# Patient Record
Sex: Female | Born: 1960 | Race: Black or African American | Hispanic: No | Marital: Married | Smoking: Never smoker
Health system: Southern US, Community
[De-identification: ages and names within clinical notes are randomized; demographics above are authoritative.]

## PROBLEM LIST (undated history)

## (undated) DIAGNOSIS — I10 Essential (primary) hypertension: Secondary | ICD-10-CM

## (undated) DIAGNOSIS — M109 Gout, unspecified: Secondary | ICD-10-CM

## (undated) HISTORY — DX: Essential (primary) hypertension: I10

## (undated) HISTORY — DX: Gout, unspecified: M10.9

## (undated) HISTORY — PX: BREAST REDUCTION SURGERY: SHX8

---

## 2004-12-26 ENCOUNTER — Emergency Department (HOSPITAL_COMMUNITY): Admission: EM | Admit: 2004-12-26 | Discharge: 2004-12-26 | Payer: Self-pay | Admitting: Family Medicine

## 2006-06-04 ENCOUNTER — Encounter: Admission: RE | Admit: 2006-06-04 | Discharge: 2006-06-04 | Payer: Self-pay | Admitting: Family Medicine

## 2009-01-28 ENCOUNTER — Encounter: Admission: RE | Admit: 2009-01-28 | Discharge: 2009-01-28 | Payer: Self-pay | Admitting: Internal Medicine

## 2010-01-24 ENCOUNTER — Ambulatory Visit: Payer: Self-pay | Admitting: Family Medicine

## 2010-02-08 ENCOUNTER — Ambulatory Visit: Payer: Self-pay | Admitting: Family Medicine

## 2010-06-27 ENCOUNTER — Ambulatory Visit: Payer: Self-pay | Admitting: Family Medicine

## 2010-12-10 ENCOUNTER — Encounter: Payer: Self-pay | Admitting: Internal Medicine

## 2011-05-04 ENCOUNTER — Other Ambulatory Visit (HOSPITAL_COMMUNITY)
Admission: RE | Admit: 2011-05-04 | Discharge: 2011-05-04 | Disposition: A | Discharge status: Home / Self Care | Payer: BC Managed Care – PPO | Source: Ambulatory Visit | Attending: Family Medicine | Admitting: Family Medicine

## 2011-05-04 ENCOUNTER — Other Ambulatory Visit: Payer: Self-pay | Admitting: Family Medicine

## 2011-05-04 DIAGNOSIS — Z1159 Encounter for screening for other viral diseases: Secondary | ICD-10-CM | POA: Insufficient documentation

## 2011-05-04 DIAGNOSIS — Z124 Encounter for screening for malignant neoplasm of cervix: Secondary | ICD-10-CM | POA: Insufficient documentation

## 2011-07-16 ENCOUNTER — Encounter: Payer: Self-pay | Admitting: Family Medicine

## 2012-06-15 ENCOUNTER — Emergency Department (HOSPITAL_COMMUNITY): Payer: No Typology Code available for payment source

## 2012-06-15 ENCOUNTER — Emergency Department (HOSPITAL_COMMUNITY)
Admission: EM | Admit: 2012-06-15 | Discharge: 2012-06-15 | Disposition: A | Discharge status: Home / Self Care | Payer: No Typology Code available for payment source | Attending: Emergency Medicine | Admitting: Emergency Medicine

## 2012-06-15 ENCOUNTER — Encounter (HOSPITAL_COMMUNITY): Payer: Self-pay | Admitting: Emergency Medicine

## 2012-06-15 DIAGNOSIS — I10 Essential (primary) hypertension: Secondary | ICD-10-CM | POA: Insufficient documentation

## 2012-06-15 DIAGNOSIS — Y998 Other external cause status: Secondary | ICD-10-CM | POA: Insufficient documentation

## 2012-06-15 DIAGNOSIS — S6390XA Sprain of unspecified part of unspecified wrist and hand, initial encounter: Secondary | ICD-10-CM | POA: Insufficient documentation

## 2012-06-15 DIAGNOSIS — Y93I9 Activity, other involving external motion: Secondary | ICD-10-CM | POA: Insufficient documentation

## 2012-06-15 DIAGNOSIS — E559 Vitamin D deficiency, unspecified: Secondary | ICD-10-CM | POA: Insufficient documentation
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
## 2012-06-16 NOTE — ED Provider Notes (Signed)
Medical screening examination/treatment/procedure(s) were performed by non-physician practitioner and as supervising physician I was immediately available for consultation/collaboration.  Toy Baker, MD 06/16/12 [phone]

## 2012-09-17 ENCOUNTER — Other Ambulatory Visit: Payer: Self-pay | Admitting: Family Medicine

## 2012-09-17 DIAGNOSIS — Z1231 Encounter for screening mammogram for malignant neoplasm of breast: Secondary | ICD-10-CM

## 2012-10-02 ENCOUNTER — Ambulatory Visit: Payer: BC Managed Care – PPO
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
## 2013-08-21 ENCOUNTER — Emergency Department (HOSPITAL_COMMUNITY)
Admission: EM | Admit: 2013-08-21 | Discharge: 2013-08-21 | Disposition: A | Discharge status: Home / Self Care | Payer: BC Managed Care – PPO | Source: Home / Self Care | Attending: Emergency Medicine | Admitting: Emergency Medicine

## 2013-08-21 ENCOUNTER — Encounter (HOSPITAL_COMMUNITY): Payer: Self-pay | Admitting: Emergency Medicine

## 2013-08-21 DIAGNOSIS — M109 Gout, unspecified: Secondary | ICD-10-CM

## 2013-08-21 MED ORDER — COLCHICINE 0.6 MG PO TABS: ORAL_TABLET | ORAL | Status: DC

## 2013-08-21 NOTE — ED Notes (Signed)
Right foot pain and swelling that started on Tuesday.  Patient has had similar pain in the past and pcp x-rayed foot, but did not find any source for the pain.  Patient reports pain involves great toe and portion of foot behind great toe.

## 2013-08-21 NOTE — ED Provider Notes (Signed)
Chief Complaint:   Chief Complaint  Patient presents with  . Foot Pain    History of Present Illness:   Kristina Thompson is a 52 year old female who has had a four-day history of pain in the MTP joint of the right great toe. This is swollen, warm, and tender to touch. She denies any injury, fever, chills, or other joint pain. She had the same thing 2 months ago. She saw a primary care Dr. at that time. X-rays were obtained which was negative. She took some ibuprofen and it got better on its own. She did some research on the Internet and thought this might be due to gout. Both her mother and dad have gout. She tried ibuprofen this time, but it didn't seem to help.  Review of Systems:  Other than noted above, the patient denies any of the following symptoms: Systemic:  No fevers, chills, or sweats.  No fatigue or tiredness. Musculoskeletal:  No joint pain, arthritis, bursitis, swelling, or back pain.  Neurological:  No muscular weakness, paresthesias.  PMFSH:  Past medical history, family history, social history, meds, and allergies were reviewed.  No history of gout.   She has high blood pressure and takes lisinopril/HCTZ and amlodipine.  Physical Exam:   Vital signs:  BP 135/87  Pulse 79  Temp(Src) 97.9 F (36.6 C) (Oral)  Resp 16  SpO2 100% Gen:  Alert and oriented times 3.  In no distress. Musculoskeletal:  Exam of the foot reveals there is swelling, erythema, heat, and tenderness to palpation over the MTP joint of the right great toe.  Otherwise, all joints had a full a ROM with no swelling, bruising or deformity.  No edema, pulses full. Extremities were warm and pink.  Capillary refill was brisk.  Skin:  Clear, warm and dry.  No rash. Neuro:  Alert and oriented times 3.  Muscle strength was normal.  Sensation was intact to light touch.   Results for orders placed during the hospital encounter of 08/21/13  URIC ACID      Result Value Range   Uric Acid, Serum 9.9 (*) 2.4 - 7.0 mg/dL    Assessment:  The encounter diagnosis was Gout attack.  Plan:   1.  Meds:  The following meds were prescribed:   Discharge Medication List as of 08/21/2013  2:28 PM    START taking these medications   Details  colchicine 0.6 MG tablet Take 2 now and 1 in 1 hour.  May repeat dose once daily.  For gout attack., Normal        2.  Patient Education/Counseling:  The patient was given appropriate handouts, self care instructions, and instructed in symptomatic relief including rest and activity, elevation, application of ice and compression.  Since her uric acid is elevated, I suggested that she followup with her primary care physician regarding urine lowering medication. Her hydrochlorothiazide may be contributing to the elevated uric acid. We also discussed dietary habits. She states she's careful about what she eats and doesn't need much red meat and does not drink alcohol at all.  3.  Follow up:  The patient was told to follow up if no better in 3 to 4 days, if becoming worse in any way, and given some red flag symptoms such as worsening pain or fever which would prompt immediate return.  Follow up with her primary care physician for elevated uric acid.       Reuben Likes, MD 08/21/13 [phone]

## 2013-08-21 NOTE — ED Notes (Addendum)
Uric Acid 9.9 H.  Pt. treated with Colchicine.  Message sent to Dr. Lorenz Coaster. Kristina Thompson 08/21/2013 10/4 Dr. Lorenz Coaster called pt. results and told her to f/u with her PCP for urate lowering treatment.

## 2014-01-26 ENCOUNTER — Other Ambulatory Visit (HOSPITAL_COMMUNITY)
Admission: RE | Admit: 2014-01-26 | Discharge: 2014-01-26 | Disposition: A | Discharge status: Home / Self Care | Payer: BC Managed Care – PPO | Source: Ambulatory Visit | Attending: Family Medicine | Admitting: Family Medicine

## 2014-01-26 ENCOUNTER — Other Ambulatory Visit: Payer: Self-pay | Admitting: Family Medicine

## 2014-01-26 DIAGNOSIS — Z124 Encounter for screening for malignant neoplasm of cervix: Secondary | ICD-10-CM | POA: Insufficient documentation

## 2014-01-26 DIAGNOSIS — Z1151 Encounter for screening for human papillomavirus (HPV): Secondary | ICD-10-CM | POA: Insufficient documentation

## 2014-03-31 ENCOUNTER — Other Ambulatory Visit: Payer: Self-pay | Admitting: Gastroenterology

## 2014-08-25 ENCOUNTER — Encounter (HOSPITAL_COMMUNITY): Payer: Self-pay | Admitting: Emergency Medicine

## 2014-08-25 ENCOUNTER — Emergency Department (HOSPITAL_COMMUNITY)
Admission: EM | Admit: 2014-08-25 | Discharge: 2014-08-25 | Disposition: A | Discharge status: Home / Self Care | Payer: BC Managed Care – PPO | Source: Home / Self Care | Attending: Family Medicine | Admitting: Family Medicine

## 2014-08-25 DIAGNOSIS — M109 Gout, unspecified: Secondary | ICD-10-CM

## 2014-08-25 DIAGNOSIS — M10062 Idiopathic gout, left knee: Secondary | ICD-10-CM

## 2014-08-25 DIAGNOSIS — M25562 Pain in left knee: Secondary | ICD-10-CM

## 2014-08-25 MED ORDER — BUPIVACAINE HCL (PF) 0.5 % IJ SOLN
INTRAMUSCULAR | Status: AC
  Filled 2014-08-25: qty 10

## 2014-08-25 MED ORDER — LIDOCAINE HCL (PF) 2 % IJ SOLN
INTRAMUSCULAR | Status: AC
  Filled 2014-08-25: qty 2

## 2014-08-25 MED ORDER — HYDROCODONE-ACETAMINOPHEN 5-325 MG PO TABS: 1.0000 | ORAL_TABLET | Freq: Four times a day (QID) | ORAL | Status: DC | PRN

## 2014-08-25 MED ORDER — METHYLPREDNISOLONE ACETATE 40 MG/ML IJ SUSP
40.0000 mg | Freq: Once | INTRAMUSCULAR | Status: AC
  Administered 2014-08-25: 40 mg via INTRA_ARTICULAR

## 2014-08-25 MED ORDER — METHYLPREDNISOLONE ACETATE 40 MG/ML IJ SUSP
INTRAMUSCULAR | Status: AC
  Filled 2014-08-25: qty 5

## 2014-08-25 MED ORDER — PREDNISONE 10 MG PO KIT: PACK | ORAL | Status: DC

## 2014-08-25 NOTE — ED Notes (Signed)
C/o left knee pain onset 2 days; denies inj/trauma Hx of gout; pain increases w/activity Slow steady gait Alert, no signs of acute distress.

## 2014-08-25 NOTE — ED Provider Notes (Signed)
Kristina Thompson is a 53 y.o. female who presents to Urgent Care today for left knee pain and swelling for 2 days. Patient denies any injury. Her pain is consistent with previous episodes of gout. She takes colchicine daily for gout prophylaxis. She denies any fevers or chills nausea vomiting or diarrhea. She feels well otherwise.   Past Medical History  Diagnosis Date  . Hypertension   . Vitamin D deficiency    History  Substance Use Topics  . Smoking status: Never Smoker   . Smokeless tobacco: Not on file  . Alcohol Use: No   ROS as above Medications: No current facility-administered medications for this encounter.   Current Outpatient Prescriptions  Medication Sig Dispense Refill  . amLODipine (NORVASC) 10 MG tablet Take 10 mg by mouth daily.        Marland Kitchen amLODipine (NORVASC) 10 MG tablet Take 10 mg by mouth daily.      . colchicine 0.6 MG tablet Take 2 now and 1 in 1 hour.  May repeat dose once daily.  For gout attack.  12 tablet  0  . HYDROcodone-acetaminophen (NORCO/VICODIN) 5-325 MG per tablet Take 1 tablet by mouth every 6 (six) hours as needed.  15 tablet  0  . lisinopril-hydrochlorothiazide (PRINZIDE,ZESTORETIC) 20-12.5 MG per tablet Take 1 tablet by mouth daily.        Marland Kitchen lisinopril-hydrochlorothiazide (PRINZIDE,ZESTORETIC) 20-25 MG per tablet Take 1 tablet by mouth daily.      . PredniSONE 10 MG KIT 12 day dose pack po  1 kit  0    Exam:  BP 165/95  Pulse 94  Temp(Src) 98.3 F (36.8 C) (Oral)  Resp 16  SpO2 96% Gen: Well NAD HEENT: EOMI,  MMM Lungs: Normal work of breathing. CTABL Heart: RRR no MRG Abd: NABS, Soft. Nondistended, Nontender Exts: Brisk capillary refill, warm and well perfused.  Left knee: Mild effusion. Tender. Range of motion 0-40. Stable ligamentous exam  Attempted aspiration and injection:  Consent obtained and timeout performed. Skin overlying Superior lateral patellar space cleaned with alcohol culture applied and 3 mL of lidocaine were injected  achieving good anesthesia along the planned aspiration tract. An 18-gauge inch and a half needle was used to access the superior lateral patellar space. No significant fluid was able to be aspirated. The procedure was discontinued at this time.  No results found for this or any previous visit (from the past 24 hour(s)). No results found.  Assessment and Plan: 53 y.o. female with left knee pain and swelling. Likely gout flare. Doubtful for septic arthritis as patient is afebrile and has no rash overlying the knee. Patient is already taking colchicine. Treat with prednisone and Norco. Followup with PCP.  Discussed warning signs or symptoms. Please see discharge instructions. Patient expresses understanding.     Gregor Hams, MD 08/25/14 [phone]

## 2014-08-25 NOTE — Discharge Instructions (Signed)
Thank you for coming in today. Call or go to the emergency room if you get worse, have trouble breathing, have chest pains, or palpitations.  Call or go to the ER if you develop a large red swollen joint with extreme pain or oozing puss.   Gout Gout is an inflammatory arthritis caused by a buildup of uric acid crystals in the joints. Uric acid is a chemical that is normally present in the blood. When the level of uric acid in the blood is too high it can form crystals that deposit in your joints and tissues. This causes joint redness, soreness, and swelling (inflammation). Repeat attacks are common. Over time, uric acid crystals can form into masses (tophi) near a joint, destroying bone and causing disfigurement. Gout is treatable and often preventable. CAUSES  The disease begins with elevated levels of uric acid in the blood. Uric acid is produced by your body when it breaks down a naturally found substance called purines. Certain foods you eat, such as meats and fish, contain high amounts of purines. Causes of an elevated uric acid level include:  Being passed down from parent to child (heredity).  Diseases that cause increased uric acid production (such as obesity, psoriasis, and certain cancers).  Excessive alcohol use.  Diet, especially diets rich in meat and seafood.  Medicines, including certain cancer-fighting medicines (chemotherapy), water pills (diuretics), and aspirin.  Chronic kidney disease. The kidneys are no longer able to remove uric acid well.  Problems with metabolism. Conditions strongly associated with gout include:  Obesity.  High blood pressure.  High cholesterol.  Diabetes. Not everyone with elevated uric acid levels gets gout. It is not understood why some people get gout and others do not. Surgery, joint injury, and eating too much of certain foods are some of the factors that can lead to gout attacks. SYMPTOMS   An attack of gout comes on quickly. It  causes intense pain with redness, swelling, and warmth in a joint.  Fever can occur.  Often, only one joint is involved. Certain joints are more commonly involved:  Base of the big toe.  Knee.  Ankle.  Wrist.  Finger. Without treatment, an attack usually goes away in a few days to weeks. Between attacks, you usually will not have symptoms, which is different from many other forms of arthritis. DIAGNOSIS  Your caregiver will suspect gout based on your symptoms and exam. In some cases, tests may be recommended. The tests may include:  Blood tests.  Urine tests.  X-rays.  Joint fluid exam. This exam requires a needle to remove fluid from the joint (arthrocentesis). Using a microscope, gout is confirmed when uric acid crystals are seen in the joint fluid. TREATMENT  There are two phases to gout treatment: treating the sudden onset (acute) attack and preventing attacks (prophylaxis).  Treatment of an Acute Attack.  Medicines are used. These include anti-inflammatory medicines or steroid medicines.  An injection of steroid medicine into the affected joint is sometimes necessary.  The painful joint is rested. Movement can worsen the arthritis.  You may use warm or cold treatments on painful joints, depending which works best for you.  Treatment to Prevent Attacks.  If you suffer from frequent gout attacks, your caregiver may advise preventive medicine. These medicines are started after the acute attack subsides. These medicines either help your kidneys eliminate uric acid from your body or decrease your uric acid production. You may need to stay on these medicines for a very long  time.  The early phase of treatment with preventive medicine can be associated with an increase in acute gout attacks. For this reason, during the first few months of treatment, your caregiver may also advise you to take medicines usually used for acute gout treatment. Be sure you understand your  caregiver's directions. Your caregiver may make several adjustments to your medicine dose before these medicines are effective.  Discuss dietary treatment with your caregiver or dietitian. Alcohol and drinks high in sugar and fructose and foods such as meat, poultry, and seafood can increase uric acid levels. Your caregiver or dietitian can advise you on drinks and foods that should be limited. HOME CARE INSTRUCTIONS   Do not take aspirin to relieve pain. This raises uric acid levels.  Only take over-the-counter or prescription medicines for pain, discomfort, or fever as directed by your caregiver.  Rest the joint as much as possible. When in bed, keep sheets and blankets off painful areas.  Keep the affected joint raised (elevated).  Apply warm or cold treatments to painful joints. Use of warm or cold treatments depends on which works best for you.  Use crutches if the painful joint is in your leg.  Drink enough fluids to keep your urine clear or pale yellow. This helps your body get rid of uric acid. Limit alcohol, sugary drinks, and fructose drinks.  Follow your dietary instructions. Pay careful attention to the amount of protein you eat. Your daily diet should emphasize fruits, vegetables, whole grains, and fat-free or low-fat milk products. Discuss the use of coffee, vitamin C, and cherries with your caregiver or dietitian. These may be helpful in lowering uric acid levels.  Maintain a healthy body weight. SEEK MEDICAL CARE IF:   You develop diarrhea, vomiting, or any side effects from medicines.  You do not feel better in 24 hours, or you are getting worse. SEEK IMMEDIATE MEDICAL CARE IF:   Your joint becomes suddenly more tender, and you have chills or a fever. MAKE SURE YOU:   Understand these instructions.  Will watch your condition.  Will get help right away if you are not doing well or get worse. Document Released: 11/02/2000 Document Revised: 03/22/2014 Document  Reviewed: 06/18/2012 [facility] Patient Information 2015 [place], [place]. This information is not intended to replace advice given to you by your health care provider. Make sure you discuss any questions you have with your health care provider.

## 2016-02-13 ENCOUNTER — Other Ambulatory Visit: Payer: Self-pay

## 2016-02-13 DIAGNOSIS — Z1231 Encounter for screening mammogram for malignant neoplasm of breast: Secondary | ICD-10-CM

## 2016-02-14 ENCOUNTER — Ambulatory Visit
Admission: RE | Admit: 2016-02-14 | Discharge: 2016-02-14 | Disposition: A | Discharge status: Home / Self Care | Payer: BLUE CROSS/BLUE SHIELD | Source: Ambulatory Visit

## 2016-02-14 DIAGNOSIS — Z1231 Encounter for screening mammogram for malignant neoplasm of breast: Secondary | ICD-10-CM

## 2017-03-12 ENCOUNTER — Other Ambulatory Visit (HOSPITAL_COMMUNITY)
Admission: RE | Admit: 2017-03-12 | Discharge: 2017-03-12 | Disposition: A | Discharge status: Home / Self Care | Payer: BLUE CROSS/BLUE SHIELD | Source: Ambulatory Visit | Attending: Family Medicine | Admitting: Family Medicine

## 2017-03-12 ENCOUNTER — Other Ambulatory Visit: Payer: Self-pay | Admitting: Family Medicine

## 2017-03-12 DIAGNOSIS — Z01411 Encounter for gynecological examination (general) (routine) with abnormal findings: Secondary | ICD-10-CM | POA: Insufficient documentation

## 2017-03-15 LAB — CYTOLOGY - PAP: DIAGNOSIS: NEGATIVE

## 2017-04-09 ENCOUNTER — Other Ambulatory Visit: Payer: Self-pay | Admitting: Internal Medicine

## 2017-04-09 DIAGNOSIS — Z1231 Encounter for screening mammogram for malignant neoplasm of breast: Secondary | ICD-10-CM

## 2017-05-06 ENCOUNTER — Ambulatory Visit
Admission: RE | Admit: 2017-05-06 | Discharge: 2017-05-06 | Disposition: A | Discharge status: Home / Self Care | Payer: BLUE CROSS/BLUE SHIELD | Source: Ambulatory Visit | Attending: Internal Medicine | Admitting: Internal Medicine

## 2017-05-06 DIAGNOSIS — Z1231 Encounter for screening mammogram for malignant neoplasm of breast: Secondary | ICD-10-CM

## 2018-04-02 DIAGNOSIS — I10 Essential (primary) hypertension: Secondary | ICD-10-CM | POA: Diagnosis not present

## 2018-04-09 DIAGNOSIS — M5441 Lumbago with sciatica, right side: Secondary | ICD-10-CM | POA: Diagnosis not present

## 2018-04-09 DIAGNOSIS — M9903 Segmental and somatic dysfunction of lumbar region: Secondary | ICD-10-CM | POA: Diagnosis not present

## 2018-04-09 DIAGNOSIS — M9905 Segmental and somatic dysfunction of pelvic region: Secondary | ICD-10-CM | POA: Diagnosis not present

## 2018-04-18 DIAGNOSIS — R7301 Impaired fasting glucose: Secondary | ICD-10-CM | POA: Diagnosis not present

## 2018-04-18 DIAGNOSIS — Z79899 Other long term (current) drug therapy: Secondary | ICD-10-CM | POA: Diagnosis not present

## 2018-04-18 DIAGNOSIS — I10 Essential (primary) hypertension: Secondary | ICD-10-CM | POA: Diagnosis not present

## 2018-04-18 DIAGNOSIS — Z0000 Encounter for general adult medical examination without abnormal findings: Secondary | ICD-10-CM | POA: Diagnosis not present

## 2018-04-18 DIAGNOSIS — E559 Vitamin D deficiency, unspecified: Secondary | ICD-10-CM | POA: Diagnosis not present

## 2018-04-25 DIAGNOSIS — E876 Hypokalemia: Secondary | ICD-10-CM | POA: Diagnosis not present

## 2018-06-02 DIAGNOSIS — D126 Benign neoplasm of colon, unspecified: Secondary | ICD-10-CM | POA: Diagnosis not present

## 2018-06-02 DIAGNOSIS — Z86010 Personal history of colonic polyps: Secondary | ICD-10-CM | POA: Diagnosis not present

## 2018-06-10 DIAGNOSIS — M47816 Spondylosis without myelopathy or radiculopathy, lumbar region: Secondary | ICD-10-CM | POA: Diagnosis not present

## 2018-06-20 ENCOUNTER — Ambulatory Visit: Payer: Self-pay | Admitting: Podiatry

## 2018-06-27 ENCOUNTER — Other Ambulatory Visit: Payer: Self-pay | Admitting: Podiatry

## 2018-06-27 ENCOUNTER — Encounter: Payer: Self-pay | Admitting: Podiatry

## 2018-06-27 ENCOUNTER — Ambulatory Visit (INDEPENDENT_AMBULATORY_CARE_PROVIDER_SITE_OTHER): Payer: Commercial Managed Care - PPO | Admitting: Podiatry

## 2018-06-27 ENCOUNTER — Ambulatory Visit (INDEPENDENT_AMBULATORY_CARE_PROVIDER_SITE_OTHER): Payer: Commercial Managed Care - PPO

## 2018-06-27 VITALS — BP 149/89 | HR 72 | Resp 16

## 2018-06-27 DIAGNOSIS — M10071 Idiopathic gout, right ankle and foot: Secondary | ICD-10-CM

## 2018-06-27 DIAGNOSIS — M778 Other enthesopathies, not elsewhere classified: Secondary | ICD-10-CM

## 2018-06-27 DIAGNOSIS — M10072 Idiopathic gout, left ankle and foot: Secondary | ICD-10-CM | POA: Diagnosis not present

## 2018-06-27 DIAGNOSIS — M779 Enthesopathy, unspecified: Secondary | ICD-10-CM

## 2018-06-27 DIAGNOSIS — M1000 Idiopathic gout, unspecified site: Secondary | ICD-10-CM | POA: Diagnosis not present

## 2018-06-27 MED ORDER — ALLOPURINOL 100 MG PO TABS: 100.0000 mg | ORAL_TABLET | Freq: Every day | ORAL | 6 refills | Status: DC

## 2018-06-28 LAB — URIC ACID: Uric Acid: 7.9 mg/dL — ABNORMAL HIGH (ref 2.5–7.1)

## 2018-06-30 NOTE — Progress Notes (Signed)
   HPI: 57 year old female presenting today as a new patient with a chief complaint of aching to the dorsum of the left midfoot that began a few months ago. She reports associated swelling that she believes is a cyst. Wearing shoes increases the pain. She has been taking Aleve for the pain. She reports h/o gout of the 1st MPJ of the right foot with the last flare up two weeks ago. Patient is here for further evaluation and treatment.   Past Medical History:  Diagnosis Date  . Hypertension   . Vitamin D deficiency      Physical Exam: General: The patient is alert and oriented x3 in no acute distress.  Dermatology: Skin is warm, dry and supple bilateral lower extremities. Negative for open lesions or macerations.  Vascular: Palpable pedal pulses bilaterally. No edema or erythema noted. Capillary refill within normal limits.  Neurological: Epicritic and protective threshold grossly intact bilaterally.   Musculoskeletal Exam: Range of motion within normal limits to all pedal and ankle joints bilateral. Muscle strength 5/5 in all groups bilateral.   Radiographic Exam:  Normal osseous mineralization. Joint spaces preserved. No fracture/dislocation/boney destruction.    Assessment: 1. H/o recurrent gout attacks    Plan of Care:  1. Patient evaluated. X-Rays reviewed.  2. Order for uric acid levels placed.  3. Prescription for Allopurinol 100 mg daily provided to patient.  4. Recommended follow up with PCP for long term management.  5. Return to clinic as needed.      Edrick Kins, DPM [facility]  Dr. Edrick Kins, DPM    [address], [place] [address]                Office [phone]  Fax [phone]

## 2019-02-02 ENCOUNTER — Other Ambulatory Visit: Payer: Self-pay | Admitting: Podiatry

## 2019-07-16 DIAGNOSIS — I10 Essential (primary) hypertension: Secondary | ICD-10-CM | POA: Insufficient documentation

## 2019-07-16 DIAGNOSIS — M109 Gout, unspecified: Secondary | ICD-10-CM | POA: Insufficient documentation

## 2020-01-29 DIAGNOSIS — N183 Chronic kidney disease, stage 3 unspecified: Secondary | ICD-10-CM | POA: Insufficient documentation

## 2022-01-31 NOTE — Progress Notes (Signed)
[facility] PREP Evaluation ? ?Patient Details  ?Name: Kristina Thompson ?MRN: [id number] ?Date of Birth: 04-May-1961 ?Age: 61 y.o. ?PCP: Jonathon Jordan, MD ? ?Vitals:  ? 01/30/22 1830  ?BP: 132/86  ?Pulse: 65  ?SpO2: 96%  ?Weight: 202 lb 9.6 oz (91.9 kg)  ? ? ? [facility] Eval - 01/31/22 1400   ? ?  ? [facility]PREP[facility] Location  ? [facility]PREP[facility] Location [place]   ?  ? Referral   ? Referring Provider self   request for referral out to PCP Ashby Dawes LM with staff  ? Reason for referral Hypertension   ? Program Start Date 02/06/22   T/TH 615p-730p x 12 wks  ?  ? Measurement  ? Waist Circumference 40 inches   ? Hip Circumference 45 inches   ? Body fat 42.6 percent   ?  ? Information for Trainer  ? Goals Lose 50 lbs, get healthy   ? Current Exercise walks q d 10K steps   ? Orthopedic Concerns none   ? Pertinent Medical History HTN   ? Current Barriers none   ? Medications that affect exercise Beta blocker;Medication causing dizziness/drowsiness   ?  ? Timed Up and Go (TUGS)  ? Timed Up and Go Low risk <9 seconds   ?  ? Mobility and Daily Activities  ? I find it easy to walk up or down two or more flights of stairs. 4   ? I have no trouble taking out the trash. 4   ? I do housework such as vacuuming and dusting on my own without difficulty. 4   ? I can easily lift a gallon of milk (8lbs). 4   ? I can easily walk a mile. 4   ? I have no trouble reaching into high cupboards or reaching down to pick up something from the floor. 4   ? I do not have trouble doing out-door work such as Armed forces logistics/support/administrative officer, raking leaves, or gardening. 4   ?  ? Mobility and Daily Activities  ? I feel younger than my age. 4   ? I feel independent. 4   ? I feel energetic. 3   ? I live an active life.  4   ? I feel strong. 2   ? I feel healthy. 4   ? I feel active as other people my age. 4   ?  ? How fit and strong are you.  ? Fit and Strong Total Score 53   ? ?  ?  ? ?  ? ?Past Medical History:  ?Diagnosis Date  ? Hypertension   ? Vitamin D deficiency   ? ?Past  Surgical History:  ?Procedure Laterality Date  ? BREAST REDUCTION SURGERY Bilateral   ? ?Social History  ? ?Tobacco Use  ?Smoking Status Never  ?Smokeless Tobacco Never  ? ? ?Barnett Hatter ?01/31/2022, 2:56 PM ? ? ?

## 2022-02-07 NOTE — Progress Notes (Signed)
[facility] PREP Weekly Session ? ?Patient Details  ?Name: Kristina Thompson ?MRN: [id number] ?Date of Birth: 07/15/1961 ?Age: 61 y.o. ?PCP: Jonathon Jordan, MD ? ?There were no vitals filed for this visit. ? ? [facility] Weekly seesion - 02/07/22 0900   ? ?  ? [facility] "PREP" Location  ? [facility]PREP[facility] Location [place]   ?  ? Weekly Session  ? Topic Discussed Goal setting and welcome to the program   scale of perceived exertion, fit testing done  ? Classes attended to date 1   ? ?  ?  ? ?  ? ? ?Barnett Hatter ?02/07/2022, 9:16 AM ? ? ?

## 2022-02-28 NOTE — Progress Notes (Signed)
[facility] PREP Weekly Session ? ?Patient Details  ?Name: Kristina Thompson ?MRN: [id number] ?Date of Birth: 04-18-1961 ?Age: 61 y.o. ?PCP: Jonathon Jordan, MD ? ?Vitals:  ? 02/27/22 1830  ?Weight: 207 lb (93.9 kg)  ? ? ? [facility] Weekly seesion - 02/28/22 0900   ? ?  ? [facility]PREP[facility] Location  ? [facility] "PREP[facility] Location [place]   ?  ? Weekly Session  ? Topic Discussed Healthy eating tips   ? Minutes exercised this week 270 minutes   ? Classes attended to date 6   ? ?  ?  ? ?  ? ? ?Barnett Hatter ?02/28/2022, 9:35 AM ? ? ?

## 2022-05-02 ENCOUNTER — Ambulatory Visit: Payer: Commercial Managed Care - PPO | Admitting: Interventional Cardiology

## 2022-05-02 ENCOUNTER — Encounter: Payer: Self-pay | Admitting: Interventional Cardiology

## 2022-05-02 VITALS — BP 140/90 | HR 61 | Ht 65.0 in | Wt 212.0 lb

## 2022-05-02 DIAGNOSIS — R9431 Abnormal electrocardiogram [ECG] [EKG]: Secondary | ICD-10-CM | POA: Diagnosis not present

## 2022-05-02 DIAGNOSIS — I10 Essential (primary) hypertension: Secondary | ICD-10-CM | POA: Diagnosis not present

## 2022-05-02 DIAGNOSIS — Z8249 Family history of ischemic heart disease and other diseases of the circulatory system: Secondary | ICD-10-CM | POA: Diagnosis not present

## 2022-05-02 DIAGNOSIS — I517 Cardiomegaly: Secondary | ICD-10-CM

## 2022-05-02 NOTE — Progress Notes (Signed)
PREP final class 04/26/22 Did not attend final measurements Attended 13 of 24 workouts, and 7 of 12 educational sessions

## 2022-05-02 NOTE — Patient Instructions (Addendum)
Medication Instructions:  Your physician recommends that you continue on your current medications as directed. Please refer to the Current Medication list given to you today.  *If you need a refill on your cardiac medications before your next appointment, please call your pharmacy*   Lab Work: none If you have labs (blood work) drawn today and your tests are completely normal, you will receive your results only by: [facility] (if you have MyChart) OR A paper copy in the mail If you have any lab test that is abnormal or we need to change your treatment, we will call you to review the results.   Testing/Procedures: Your physician has requested that you have an echocardiogram. Echocardiography is a painless test that uses sound waves to create images of your heart. It provides your doctor with information about the size and shape of your heart and how well your heart's chambers and valves are working. This procedure takes approximately one hour. There are no restrictions for this procedure.  Dr Irish Lack recommends you have a Calcium Score CT scan--to be done at [facility]: At [facility], you and your health needs are our priority.  As part of our continuing mission to provide you with exceptional heart care, we have created designated Provider Care Teams.  These Care Teams include your primary Cardiologist (physician) and Advanced Practice Providers (APPs -  Physician Assistants and Nurse Practitioners) who all work together to provide you with the care you need, when you need it.  We recommend signing up for the patient portal called "MyChart".  Sign up information is provided on this After Visit Summary.  MyChart is used to connect with patients for Virtual Visits (Telemedicine).  Patients are able to view lab/test results, encounter notes, upcoming appointments, etc.  Non-urgent messages can be sent to your provider as well.   To learn more about  what you can do with MyChart, go to [email or web address].    Your next appointment:   As needed  The format for your next appointment:   In Person  Provider:   Larae Grooms, MD     Other Instructions    Important Information About Sugar

## 2022-05-02 NOTE — Progress Notes (Signed)
Cardiology Office Note   Date:  05/02/2022   ID:  Kristina Thompson, DOB Feb 07, 1961, MRN [id number]  PCP:  Merrilee Seashore, MD    No chief complaint on file.  HTN  Wt Readings from Last 3 Encounters:  05/02/22 212 lb (96.2 kg)  02/27/22 207 lb (93.9 kg)  01/30/22 202 lb 9.6 oz (91.9 kg)       History of Present Illness: Kristina Thompson is a 61 y.o. female who is being seen today for the evaluation of family h/o cardiac disease at the request of Merrilee Seashore, MD.   She has had HTN for years requiring 3 meds.  SHe wants to lose weight.   Several family members have had a "weak heart" unrelated to blocked arteries.  No one in the family with a transplant.  She states several people had heart attacks in the family.  Sister has required some testing.    Denies : Chest pain. Dizziness. Leg edema. Nitroglycerin use. Orthopnea. Palpitations. Paroxysmal nocturnal dyspnea. Shortness of breath. Syncope.    Not much walking.  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  She works from home 4 days a week.  She does not get much walking in her daily routine.  Eats healthy.      Past Medical History:  Diagnosis Date   Gout    Hypertension    Vitamin D deficiency     Past Surgical History:  Procedure Laterality Date   BREAST REDUCTION SURGERY Bilateral      Current Outpatient Medications  Medication Sig Dispense Refill   AMLODIPINE BESYLATE PO Take 5 mg by mouth daily.     colchicine 0.6 MG tablet Take 2 now and 1 in 1 hour.  May repeat dose once daily.  For gout attack. 12 tablet 0   losartan-hydrochlorothiazide (HYZAAR) 100-25 MG tablet Take 1 tablet by mouth daily.     Metoprolol Succinate 25 MG CS24 Take 25 mg by mouth daily.     No current facility-administered medications for this visit.    Allergies:   Patient has no known allergies.    Social History:  The patient  reports that she has never smoked. She has never used smokeless tobacco. She  reports that she does not drink alcohol and does not use drugs.   Family History:  The patient's family history includes Diabetes in her mother; Heart failure in her paternal aunt; High blood pressure in her father and mother.    ROS:  Please see the history of present illness.   Otherwise, review of systems are positive for difficulty losing weight.   All other systems are reviewed and negative.    PHYSICAL EXAM: VS:  BP 140/90   Pulse 61   Ht '5\' 5"'$  (1.651 m)   Wt 212 lb (96.2 kg)   SpO2 99%   BMI 35.28 kg/m  , BMI Body mass index is 35.28 kg/m. GEN: Well nourished, well developed, in no acute distress HEENT: normal Neck: no JVD, carotid bruits, or masses Cardiac: RRR; no murmurs, rubs, or gallops,no edema  Respiratory:  clear to auscultation bilaterally, normal work of breathing GI: soft, nontender, nondistended, + BS MS: no deformity or atrophy Skin: warm and dry, no rash Neuro:  Strength and sensation are intact Psych: euthymic mood, full affect   EKG:   The ekg ordered today demonstrates NSR, no ST changes   Recent Labs: No results found for requested labs within last 365 days.   Lipid  Panel No results found for: "CHOL", "TRIG", "HDL", "CHOLHDL", "VLDL", "LDLCALC", "LDLDIRECT"   Other studies Reviewed: Additional studies/ records that were reviewed today with results demonstrating: total cholesterol 180 in 02/2022.   ASSESSMENT AND PLAN:  Family h/o CAD: Concern for ischemic heart disease and cardiomyopathy.  She has had relatives that have had both heart attacks as well as nonischemic cardiomyopathy. HTN: Continue current meds. Avoid processed foods.  Morbid obesity: Working on weight loss.  Has a treadmill.   Abnormal ECG/LVH: plan for echo.  Will be helpful especially given family history of nonischemic cardiomyopathy.   Current medicines are reviewed at length with the patient today.  The patient concerns regarding her medicines were addressed.  The  following changes have been made:  No change  Labs/ tests ordered today include:  No orders of the defined types were placed in this encounter.   Recommend 150 minutes/week of aerobic exercise Low fat, low carb, high fiber diet recommended  Disposition:   FU as needed   Signed, Larae Grooms, MD  05/02/2022 4:29 PM    [facility] [place], [place], [place]  [address] Phone: [phone]; Fax: [phone]

## 2022-05-15 ENCOUNTER — Inpatient Hospital Stay: Admission: RE | Admit: 2022-05-15 | Payer: Commercial Managed Care - PPO | Source: Ambulatory Visit

## 2022-05-15 ENCOUNTER — Ambulatory Visit (HOSPITAL_COMMUNITY): Payer: Commercial Managed Care - PPO

## 2022-05-18 ENCOUNTER — Ambulatory Visit (HOSPITAL_COMMUNITY): Payer: Commercial Managed Care - PPO | Attending: Cardiology

## 2022-05-18 ENCOUNTER — Ambulatory Visit (INDEPENDENT_AMBULATORY_CARE_PROVIDER_SITE_OTHER)
Admission: RE | Admit: 2022-05-18 | Discharge: 2022-05-18 | Disposition: A | Discharge status: Home / Self Care | Payer: Self-pay | Source: Ambulatory Visit | Attending: Interventional Cardiology | Admitting: Interventional Cardiology

## 2022-05-18 DIAGNOSIS — I517 Cardiomegaly: Secondary | ICD-10-CM

## 2022-05-18 DIAGNOSIS — Z8249 Family history of ischemic heart disease and other diseases of the circulatory system: Secondary | ICD-10-CM | POA: Insufficient documentation

## 2022-05-18 DIAGNOSIS — I10 Essential (primary) hypertension: Secondary | ICD-10-CM | POA: Insufficient documentation

## 2022-05-18 DIAGNOSIS — R9431 Abnormal electrocardiogram [ECG] [EKG]: Secondary | ICD-10-CM | POA: Insufficient documentation

## 2022-05-18 LAB — ECHOCARDIOGRAM COMPLETE
Area-P 1/2: 3.3 cm2
S' Lateral: 2.9 cm

## 2022-05-29 ENCOUNTER — Other Ambulatory Visit (HOSPITAL_COMMUNITY): Payer: Commercial Managed Care - PPO

## 2022-05-31 ENCOUNTER — Other Ambulatory Visit: Payer: Self-pay | Admitting: *Deleted

## 2022-05-31 DIAGNOSIS — Z79899 Other long term (current) drug therapy: Secondary | ICD-10-CM

## 2022-05-31 DIAGNOSIS — R931 Abnormal findings on diagnostic imaging of heart and coronary circulation: Secondary | ICD-10-CM

## 2022-05-31 MED ORDER — ROSUVASTATIN CALCIUM 10 MG PO TABS: 10.0000 mg | ORAL_TABLET | Freq: Every day | ORAL | 3 refills | Status: DC

## 2022-07-25 ENCOUNTER — Telehealth: Payer: Self-pay | Admitting: Interventional Cardiology

## 2022-07-25 DIAGNOSIS — R931 Abnormal findings on diagnostic imaging of heart and coronary circulation: Secondary | ICD-10-CM

## 2022-07-25 DIAGNOSIS — Z79899 Other long term (current) drug therapy: Secondary | ICD-10-CM

## 2022-07-25 NOTE — Telephone Encounter (Signed)
Can increase the risk of muscle pain when taken together

## 2022-07-25 NOTE — Telephone Encounter (Signed)
I spoke with patient and gave her message from pharmacist.  Patient reports she has been taking colchicine daily for a couple of years.  She started noticing muscle pain in her legs after starting Rosuvastatin.  She stopped Rosuvastatin and pain improved.  Will forward to Dr Irish Lack regarding possible alternatives for Rosuvastatin

## 2022-07-25 NOTE — Telephone Encounter (Signed)
Pt c/o medication issue:  1. Name of Medication:   rosuvastatin (CRESTOR) 10 MG tablet    colchicine 0.6 MG tablet    2. How are you currently taking this medication (dosage and times per day)?   3. Are you having a reaction (difficulty breathing--STAT)? No  4. What is your medication issue? Pt states that pharmacy made her aware that above medications have an interaction with each other. Pt would like a callback regarding this matter

## 2022-07-27 NOTE — Telephone Encounter (Signed)
Jettie Booze, MD     Can refer to PharmD lipid clinic    I spoke with patient and scheduled lipid clinic appointment for July 31, 2022 at 8:30

## 2022-07-31 ENCOUNTER — Ambulatory Visit: Payer: Commercial Managed Care - PPO | Attending: Internal Medicine | Admitting: Pharmacist

## 2022-07-31 VITALS — BP 140/101 | HR 63

## 2022-07-31 DIAGNOSIS — Z8249 Family history of ischemic heart disease and other diseases of the circulatory system: Secondary | ICD-10-CM

## 2022-07-31 DIAGNOSIS — I10 Essential (primary) hypertension: Secondary | ICD-10-CM | POA: Diagnosis not present

## 2022-07-31 DIAGNOSIS — E785 Hyperlipidemia, unspecified: Secondary | ICD-10-CM | POA: Diagnosis not present

## 2022-07-31 DIAGNOSIS — R931 Abnormal findings on diagnostic imaging of heart and coronary circulation: Secondary | ICD-10-CM

## 2022-07-31 MED ORDER — AMLODIPINE BESYLATE 10 MG PO TABS: 10.0000 mg | ORAL_TABLET | Freq: Every day | ORAL | 3 refills | Status: AC

## 2022-07-31 NOTE — Progress Notes (Unsigned)
Patient ID: Kristina Thompson                 DOB: 02-02-1961                    MRN: [id number]      HPI: Kristina Thompson is a 61 y.o. female patient referred to lipid clinic by Dr. Irish Lack. PMH is significant for family h/o cardiac disease, HTN and gout. Patient had CAC score done, which showed a score of 1 (75th percentile). Patient started on rosuvastatin '10mg'$  daily. Also taking colchicine daily. Pharmacist notified her that there was an interaction. She reports she started noticing muscle pain in her legs after starting Rosuvastatin.  She stopped Rosuvastatin and pain improved. Referred to lipid clinic to discuss.  Patient presents today to lipid clinic. She is a Mudlogger of one of my patients, [name]. She reports that her muscle aches started a few days after starting rosuvastatin. Has taken colchicine for years for gout prevention. Reports that she also struggles to control her BP. Diastolic is the biggest issue. Does check at home. Previously on losartan/HCTZ but HCTZ stopped due to gout.    Current Medications: none Current medications:   Previous: lisinopril, HCTZ (gout) Intolerances: rosuvastatin '10mg'$  daily (muscle aches) Risk Factors: HTN, family hx LDL goal: <70  Diet: not discussed today  Exercise: walk challenge- walks about 4 miles per day (started a couple weeks ago)  Family History: The patient's family history includes Diabetes in her mother; Heart failure in her paternal aunt; High blood pressure in her father and mother.   Social History: The patient  reports that she has never smoked. She has never used smokeless tobacco. She reports that she does not drink alcohol and does not use drugs.   Labs:   Component 06/16/2020       Cholesterol, Total 181   Triglycerides 73   HDL 54   VLDL Cholesterol Cal 14   LDL 113 High      LDL/HDL Ratio --     Past Medical History:  Diagnosis Date   Gout    Hypertension    Vitamin D deficiency     Current Outpatient  Medications on File Prior to Visit  Medication Sig Dispense Refill   AMLODIPINE BESYLATE PO Take 5 mg by mouth daily.     colchicine 0.6 MG tablet Take 2 now and 1 in 1 hour.  May repeat dose once daily.  For gout attack. 12 tablet 0   losartan-hydrochlorothiazide (HYZAAR) 100-25 MG tablet Take 1 tablet by mouth daily.     Metoprolol Succinate 25 MG CS24 Take 25 mg by mouth daily.     No current facility-administered medications on file prior to visit.    No Known Allergies  Assessment/Plan:  1. Hyperlipidemia - LDL-C is above goal of <70. I reviewed CAC score with patient and process of atherosclerosis and why we want to be aggressive with her risk factors. Reviewed that all statins have an interaction with colchicine, but that with lower doses, could be mitigated. I gave the patient the option of either trying a lower rosuvastatin dose or a different statin. We will first try a lower dose of rosuvastatin. If she does not tolerate that, then we will try a different statin. Start rosuvastatin '5mg'$  three times a week. Labs already ordered for 10/12. Will add on ApoB.  We discussed the 5 modifiable risk factors for CVD (lipids, BP, Tobacco use, diet and exercise) BP  not well controlled, patient does not use tobacco. Lipids discussed above.   I have encouraged patient to continue exercising. She started walking about 2 weeks ago.   2. HTN- Blood pressure elevated in clinic and at home. She wants to be on as little BP medications as possible. Will increase amlodipine to '10mg'$  daily. Continue losartan '100mg'$  daily and metoprolol succinate '25mg'$  daily. I do not see a strong indication for a beta blocker. We might be able to d/c this in the future. Follow up in 4 weeks. I have reviewed proper BP technique and asked pt to bring her log and cuff with her to next appointment.  Thank you,   Ramond Dial, Pharm.D, BCPS, CPP [facility]  [address] [place], [place] [address]   Phone: [phone]; Fax: [phone]

## 2022-07-31 NOTE — Patient Instructions (Addendum)
Start taking rosuvastatin '5mg'$  (1/2 tablet) on Monday, Wednesday and Fridays. Please increase amlodipine to '10mg'$  daily. Continue losartan '100mg'$  daily and metoprolol '25mg'$  daily.  Please call me at [phone] with any issues.  Your blood pressure goal is <130  To check your pressure at home you will need to:  1. Sit up in a chair, with feet flat on the floor and back supported. Do not cross your ankles or legs. 2. Rest your left arm so that the cuff is about heart level. If the cuff goes on your upper arm,  then just relax the arm on the table, arm of the chair or your lap. If you have a wrist cuff, we  suggest relaxing your wrist against your chest (think of it as Pledging the Flag with the  wrong arm).  3. Place the cuff snugly around your arm, about 1 inch above the crook of your elbow. The  cords should be inside the groove of your elbow.  4. Sit quietly, with the cuff in place, for about 5 minutes. After that 5 minutes press the power  button to start a reading. 5. Do not talk or move while the reading is taking place.  6. Record your readings on a sheet of paper. Although most cuffs have a memory, it is often  easier to see a pattern developing when the numbers are all in front of you.  7. You can repeat the reading after 1-3 minutes if it is recommended  Make sure your bladder is empty and you have not had caffeine or tobacco within the last 30 min  Always bring your blood pressure log with you to your appointments. If you have not brought your monitor in to be double checked for accuracy, please bring it to your next appointment.  You can find a list of validated (accurate) blood pressure cuffs at validatebp.org

## 2022-08-30 ENCOUNTER — Ambulatory Visit: Payer: Commercial Managed Care - PPO | Attending: Interventional Cardiology

## 2022-08-30 DIAGNOSIS — Z8249 Family history of ischemic heart disease and other diseases of the circulatory system: Secondary | ICD-10-CM

## 2022-08-30 DIAGNOSIS — Z79899 Other long term (current) drug therapy: Secondary | ICD-10-CM

## 2022-08-30 DIAGNOSIS — R931 Abnormal findings on diagnostic imaging of heart and coronary circulation: Secondary | ICD-10-CM

## 2022-08-31 ENCOUNTER — Telehealth: Payer: Self-pay | Admitting: Pharmacist

## 2022-08-31 LAB — HEPATIC FUNCTION PANEL
ALT: 34 IU/L — ABNORMAL HIGH (ref 0–32)
AST: 22 IU/L (ref 0–40)
Albumin: 4.2 g/dL (ref 3.8–4.9)
Alkaline Phosphatase: 74 IU/L (ref 44–121)
Bilirubin Total: 0.4 mg/dL (ref 0.0–1.2)
Bilirubin, Direct: 0.14 mg/dL (ref 0.00–0.40)
Total Protein: 7.2 g/dL (ref 6.0–8.5)

## 2022-08-31 LAB — APOLIPOPROTEIN B: Apolipoprotein B: 73 mg/dL (ref <90)

## 2022-08-31 LAB — LIPID PANEL
Chol/HDL Ratio: 2.7 ratio (ref 0.0–4.4)
Cholesterol, Total: 146 mg/dL (ref 100–199)
HDL: 54 mg/dL (ref >39)
LDL Chol Calc (NIH): 77 mg/dL (ref 0–99)
Triglycerides: 76 mg/dL (ref 0–149)
VLDL Cholesterol Cal: 15 mg/dL (ref 5–40)

## 2022-08-31 MED ORDER — ROSUVASTATIN CALCIUM 10 MG PO TABS: 10.0000 mg | ORAL_TABLET | Freq: Every day | ORAL | 3 refills | Status: DC

## 2022-08-31 NOTE — Telephone Encounter (Signed)
Patient called back. She would like to try to increase to '10mg'$  three times a week. Repeat labs in 3 months if she tolerates higher dose.

## 2022-08-31 NOTE — Telephone Encounter (Signed)
LDL-C and ApoB close to goal. Ideally would like a little lower. Will see how patient is doing on rosuvastatin 3 times a week. If willing, will increase to daily. Called pt and LVM for her to call back.

## 2022-09-06 ENCOUNTER — Ambulatory Visit: Payer: Commercial Managed Care - PPO

## 2022-09-06 NOTE — Progress Notes (Deleted)
Patient ID: Kristina Thompson                 DOB: [birth year]                    MRN: [id number]      HPI: Kristina Thompson is a 61 y.o. female patient referred to lipid clinic by Dr. Irish Lack. PMH is significant for family h/o cardiac disease, HTN and gout. Patient had CAC score done, which showed a score of 1 (75th percentile). Patient started on rosuvastatin '10mg'$  daily. Also taking colchicine daily. Pharmacist notified her that there was an interaction. She reports she started noticing muscle pain in her legs after starting Rosuvastatin.  She stopped Rosuvastatin and pain improved. Referred to lipid clinic to discuss.  Patient was seen on 07/31/22 in PharmD clinic. She was resumed on rosuvastatin '5mg'$  three times a week. Follow up labs showed an ApoB 73, LDL-C 77. Patient was agreeable to increasing rosuvastatin '10mg'$  three times a week.   Her BP was also elevated. Amlodipine was increased to '10mg'$  daily.   Patient presents today to lipid clinic. She is a Mudlogger of one of my patients, Kristina Thompson. She reports that her muscle aches started a few days after starting rosuvastatin. Has taken colchicine for years for gout prevention. Reports that she also struggles to control her BP. Diastolic is the biggest issue. Does check at home. Previously on losartan/HCTZ but HCTZ stopped due to gout.    Current Medications: none Current medications:   Previous: lisinopril, HCTZ (gout) Intolerances: rosuvastatin '10mg'$  daily (muscle aches) Risk Factors: HTN, family hx LDL goal: <70  Diet: not discussed today  Exercise: walk challenge- walks about 4 miles per day (started a couple weeks ago)  Family History: The patient's family history includes Diabetes in her mother; Heart failure in her paternal aunt; High blood pressure in her father and mother.   Social History: The patient  reports that she has never smoked. She has never used smokeless tobacco. She reports that she does not drink alcohol and does not use  drugs.   Labs:   Component 06/16/2020       Cholesterol, Total 181   Triglycerides 73   HDL 54   VLDL Cholesterol Cal 14   LDL 113 High      LDL/HDL Ratio --     Past Medical History:  Diagnosis Date   Gout    Hypertension    Vitamin D deficiency     Current Outpatient Medications on File Prior to Visit  Medication Sig Dispense Refill   amLODipine (NORVASC) 10 MG tablet Take 1 tablet (10 mg total) by mouth daily. 90 tablet 3   colchicine 0.6 MG tablet Take 1 tablet by mouth daily.     losartan (COZAAR) 100 MG tablet Take 100 mg by mouth daily.     metoprolol succinate (TOPROL-XL) 25 MG 24 hr tablet Take 25 mg by mouth daily.     rosuvastatin (CRESTOR) 10 MG tablet Take 1 tablet (10 mg total) by mouth daily. 36 tablet 3   No current facility-administered medications on file prior to visit.    No Known Allergies  Assessment/Plan:  1. Hyperlipidemia - LDL-C is above goal of <70. I reviewed CAC score with patient and process of atherosclerosis and why we want to be aggressive with her risk factors. Reviewed that all statins have an interaction with colchicine, but that with lower doses, could be mitigated. I gave the patient the  option of either trying a lower rosuvastatin dose or a different statin. We will first try a lower dose of rosuvastatin. If she does not tolerate that, then we will try a different statin. Start rosuvastatin '5mg'$  three times a week. Labs already ordered for 10/12. Will add on ApoB.  We discussed the 5 modifiable risk factors for CVD (lipids, BP, Tobacco use, diet and exercise) BP not well controlled, patient does not use tobacco. Lipids discussed above.   I have encouraged patient to continue exercising. She started walking about 2 weeks ago.   2. HTN- Blood pressure elevated in clinic and at home. She wants to be on as little BP medications as possible. Will increase amlodipine to '10mg'$  daily. Continue losartan '100mg'$  daily and metoprolol succinate '25mg'$   daily. I do not see a strong indication for a beta blocker. We might be able to d/c this in the future. Follow up in 4 weeks. I have reviewed proper BP technique and asked pt to bring her log and cuff with her to next appointment.  Thank you,   Ramond Dial, Pharm.D, BCPS, CPP [facility]  [address] [place], [place] [address]  Phone: [phone]; Fax: [phone]

## 2023-05-01 ENCOUNTER — Emergency Department (HOSPITAL_BASED_OUTPATIENT_CLINIC_OR_DEPARTMENT_OTHER)
Admission: EM | Admit: 2023-05-01 | Discharge: 2023-05-01 | Disposition: A | Discharge status: Home / Self Care | Payer: Commercial Managed Care - PPO | Attending: Emergency Medicine | Admitting: Emergency Medicine

## 2023-05-01 ENCOUNTER — Encounter (HOSPITAL_BASED_OUTPATIENT_CLINIC_OR_DEPARTMENT_OTHER): Payer: Self-pay | Admitting: Emergency Medicine

## 2023-05-01 ENCOUNTER — Other Ambulatory Visit: Payer: Self-pay

## 2023-05-01 ENCOUNTER — Emergency Department (HOSPITAL_BASED_OUTPATIENT_CLINIC_OR_DEPARTMENT_OTHER): Payer: Commercial Managed Care - PPO

## 2023-05-01 DIAGNOSIS — R109 Unspecified abdominal pain: Secondary | ICD-10-CM | POA: Diagnosis present

## 2023-05-01 DIAGNOSIS — K769 Liver disease, unspecified: Secondary | ICD-10-CM | POA: Diagnosis not present

## 2023-05-01 DIAGNOSIS — Z79899 Other long term (current) drug therapy: Secondary | ICD-10-CM | POA: Insufficient documentation

## 2023-05-01 DIAGNOSIS — N189 Chronic kidney disease, unspecified: Secondary | ICD-10-CM | POA: Insufficient documentation

## 2023-05-01 DIAGNOSIS — N3090 Cystitis, unspecified without hematuria: Secondary | ICD-10-CM | POA: Insufficient documentation

## 2023-05-01 DIAGNOSIS — I129 Hypertensive chronic kidney disease with stage 1 through stage 4 chronic kidney disease, or unspecified chronic kidney disease: Secondary | ICD-10-CM | POA: Diagnosis not present

## 2023-05-01 DIAGNOSIS — N852 Hypertrophy of uterus: Secondary | ICD-10-CM | POA: Diagnosis not present

## 2023-05-01 DIAGNOSIS — N83202 Unspecified ovarian cyst, left side: Secondary | ICD-10-CM | POA: Diagnosis not present

## 2023-05-01 DIAGNOSIS — M545 Low back pain, unspecified: Secondary | ICD-10-CM | POA: Insufficient documentation

## 2023-05-01 LAB — URINALYSIS, W/ REFLEX TO CULTURE (INFECTION SUSPECTED)
Bilirubin Urine: NEGATIVE
Glucose, UA: NEGATIVE mg/dL
Hgb urine dipstick: NEGATIVE
Ketones, ur: NEGATIVE mg/dL
Nitrite: NEGATIVE
Protein, ur: NEGATIVE mg/dL
Specific Gravity, Urine: 1.017 (ref 1.005–1.030)
pH: 6.5 (ref 5.0–8.0)

## 2023-05-01 MED ORDER — CEPHALEXIN 500 MG PO CAPS: 500.0000 mg | ORAL_CAPSULE | Freq: Four times a day (QID) | ORAL | 0 refills | Status: AC

## 2023-05-01 MED ORDER — CYCLOBENZAPRINE HCL 10 MG PO TABS: 10.0000 mg | ORAL_TABLET | Freq: Two times a day (BID) | ORAL | 0 refills | Status: AC | PRN

## 2023-05-01 NOTE — Discharge Instructions (Addendum)
You have been seen today for your complaint of left-sided flank pain. Your lab work was consistent with a UTI. Your imaging showed no acute findings but did show a couple incidental findings including an enlarged uterus consistent with uterine fibroids and a left ovarian cyst.  Recommend following up with gynecology for this.  Also showed a possible liver lesion with recommendation to obtain a nonemergent MRI.  You should follow-up with your primary care provider for this. I have included the radiologist interpretation of your CT at the bottom of this page.  Your discharge medications include Keflex. This is an antibiotic. You should take it as prescribed. You should take it for the entire duration of the prescription. This may cause an upset stomach. This is normal. You may take this with food. You may also eat yogurt to prevent diarrhea. Flexeril.  This is a muscle relaxant.  Take it as needed.  Do not drive or operate heavy machinery while taking this medication as it may cause drowsiness. Alternate tylenol and ibuprofen for pain. You may alternate these every 4 hours. You may take up to 800 mg of ibuprofen at a time and up to 1000 mg of tylenol. Please seek immediate medical care if you develop any of the following symptoms: You have trouble breathing or you are short of breath. Your abdomen hurts or it is swollen or red. You have nausea or vomiting. You feel faint, or you faint. You have blood in your urine. You have flank pain and a fever. At this time there does not appear to be the presence of an emergent medical condition, however there is always the potential for conditions to change. Please read and follow the below instructions.  Do not take your medicine if  develop an itchy rash, swelling in your mouth or lips, or difficulty breathing; call 911 and seek immediate emergency medical attention if this occurs.  You may review your lab tests and imaging results in their entirety on your  MyChart account.  Please discuss all results of fully with your primary care provider and other specialist at your follow-up visit.  Note: Portions of this text may have been transcribed using voice recognition software. Every effort was made to ensure accuracy; however, inadvertent computerized transcription errors may still be present.  1. No acute abnormality in the abdomen or pelvis. 2. Enlarged, globular appearance of the uterus with multiple foci of coarse calcifications, likely reflecting uterine fibroids. 3. Left adnexal cyst measures 3.9 x 3.5 cm. Recommend further evaluation with pelvic ultrasound examination. The uterus and endometrium can be evaluated at the same time. 4. Hypoattenuating focus at the hepatic dome measures 1.4 x 1.4 cm, incompletely characterized on this noncontrast exam. Recommend further evaluation with nonemergent liver protocol MRI or CT as clinically indicated. 5. Colonic diverticulosis without acute diverticulitis.

## 2023-05-01 NOTE — ED Triage Notes (Signed)
Pt arrives pov, steady gait with c/o LT flank pain radiating to lower back x 10 days.

## 2023-05-01 NOTE — ED Notes (Signed)
Patient transported to CT 

## 2023-05-01 NOTE — ED Provider Notes (Signed)
[place] EMERGENCY DEPARTMENT AT [facility] Provider Note   CSN: [id number] Arrival date & time: 05/01/23  4098     History  Chief Complaint  Patient presents with   Flank Pain    Kristina Thompson is a 62 y.o. female.  With a history of CKD, hypertension, gout who presents to the ED for evaluation of left-sided flank pain.  She states she noticed this pain approximately 10 days ago.  It has progressively gotten worse.  It does radiate to the low back.  States it feels like a pulled muscle.  She has tried Aleve and lidocaine patches with some improvement, however the pain then returns.  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  She denies any abdominal pain, dysuria, frequency, urgency, hematuria, nausea, vomiting, fevers, chills.  She states the pain is worse with certain movements and states it feels like she cannot get comfortable.   Flank Pain       Home Medications Prior to Admission medications   Medication Sig Start Date End Date Taking? Authorizing Provider  cephALEXin (KEFLEX) 500 MG capsule Take 1 capsule (500 mg total) by mouth 4 (four) times daily. 05/01/23  Yes Hoke Baer, Edsel Petrin, PA-C  cyclobenzaprine (FLEXERIL) 10 MG tablet Take 1 tablet (10 mg total) by mouth 2 (two) times daily as needed for muscle spasms. 05/01/23  Yes Lameka Disla, Edsel Petrin, PA-C  amLODipine (NORVASC) 10 MG tablet Take 1 tablet (10 mg total) by mouth daily. 07/31/22   Corky Crafts, MD  colchicine 0.6 MG tablet Take 1 tablet by mouth daily.    [provider]  losartan (COZAAR) 100 MG tablet Take 100 mg by mouth daily. 07/16/22   [provider]  metoprolol succinate (TOPROL-XL) 25 MG 24 hr tablet Take 25 mg by mouth daily. 07/04/22   [provider]  rosuvastatin (CRESTOR) 10 MG tablet Take 1 tablet (10 mg total) by mouth daily. 08/31/22   Corky Crafts, MD      Allergies     Patient has no known allergies.    Review of Systems   Review of Systems  Genitourinary:  Positive for flank pain.  All other systems reviewed and are negative.   Physical Exam Updated Vital Signs BP (!) 140/91   Pulse 65   Temp 97.9 F (36.6 C)   Resp 18   Ht 5\' 5"  (1.651 m)   Wt 93 kg   SpO2 99%   BMI 34.11 kg/m  Physical Exam Vitals and nursing note reviewed.  Constitutional:      General: She is not in acute distress.    Appearance: She is well-developed.     Comments: Resting comfortably in bed  HENT:     Head: Normocephalic and atraumatic.  Eyes:     Conjunctiva/sclera: Conjunctivae normal.  Cardiovascular:     Rate and Rhythm: Normal rate and regular rhythm.     Heart sounds: No murmur heard. Pulmonary:     Effort: Pulmonary effort is normal. No respiratory distress.     Breath sounds: Normal breath sounds.  Abdominal:     Palpations: Abdomen is soft.     Tenderness: There is no abdominal tenderness. There is no right CVA tenderness, left CVA tenderness or guarding.  Musculoskeletal:        General: No swelling.     Cervical back: Neck supple.     Comments: Mild TTP to left  lower back  Skin:    General: Skin is warm and dry.     Capillary Refill: Capillary refill takes less than 2 seconds.     Comments: No rashes  Neurological:     General: No focal deficit present.     Mental Status: She is alert and oriented to person, place, and time.  Psychiatric:        Mood and Affect: Mood normal.     ED Results / Procedures / Treatments   Labs (all labs ordered are listed, but only abnormal results are displayed) Labs Reviewed  URINALYSIS, W/ REFLEX TO CULTURE (INFECTION SUSPECTED) - Abnormal; Notable for the following components:      Result Value   Leukocytes,Ua SMALL (*)    Bacteria, UA RARE (*)    All other components within normal limits  URINE CULTURE    EKG None  Radiology CT Renal Stone Study  Result Date: 05/01/2023 CLINICAL DATA:  Ten  day history of left flank pain radiating to the lower back EXAM: CT ABDOMEN AND PELVIS WITHOUT CONTRAST TECHNIQUE: Multidetector CT imaging of the abdomen and pelvis was performed following the standard protocol without IV contrast. RADIATION DOSE REDUCTION: This exam was performed according to the departmental dose-optimization program which includes automated exposure control, adjustment of the mA and/or kV according to patient size and/or use of iterative reconstruction technique. COMPARISON:  None Available. FINDINGS: Lower chest: No focal consolidation or pulmonary nodule in the lung bases. No pleural effusion or pneumothorax demonstrated. Partially imaged heart size is normal. Hepatobiliary: Hypoattenuating focus at the hepatic dome measures 1.4 x 1.4 cm (2:5). No intra or extrahepatic biliary ductal dilation. Normal gallbladder. Pancreas: No focal lesions or main ductal dilation. Spleen: Normal in size without focal abnormality. Adrenals/Urinary Tract: No adrenal nodules. No suspicious renal mass, calculi or hydronephrosis. No focal bladder wall thickening. Stomach/Bowel: Normal appearance of the stomach. No evidence of bowel wall thickening, distention, or inflammatory changes. Colonic diverticulosis without acute diverticulitis. Normal appendix. Vascular/Lymphatic: No significant vascular findings are present. No enlarged abdominal or pelvic lymph nodes. Reproductive: Enlarged, globular appearance of the uterus with multiple foci of coarse calcifications. Left adnexal cyst measures 3.9 x 3.5 cm (2:57). No right adnexal mass. Other: No free fluid, fluid collection, or free air. Musculoskeletal: No acute or abnormal lytic or blastic osseous lesions. Multilevel degenerative changes of the partially imaged thoracic and lumbar spine. IMPRESSION: 1. No acute abnormality in the abdomen or pelvis. 2. Enlarged, globular appearance of the uterus with multiple foci of coarse calcifications, likely reflecting uterine  fibroids. 3. Left adnexal cyst measures 3.9 x 3.5 cm. Recommend further evaluation with pelvic ultrasound examination. The uterus and endometrium can be evaluated at the same time. 4. Hypoattenuating focus at the hepatic dome measures 1.4 x 1.4 cm, incompletely characterized on this noncontrast exam. Recommend further evaluation with nonemergent liver protocol MRI or CT as clinically indicated. 5. Colonic diverticulosis without acute diverticulitis. Electronically Signed   By: Agustin Cree M.D.   On: 05/01/2023 09:58    Procedures Procedures    Medications Ordered in ED Medications - No data to display  ED Course/ Medical Decision Making/ A&P                             Medical Decision Making Amount and/or Complexity of Data Reviewed Radiology: ordered.  This patient presents to the ED for concern of left-sided low back pain, this involves an extensive  number of treatment options, and is a complaint that carries with it a high risk of complications and morbidity. Emergent considerations in the differential diagnosis of back pain include:occult fracture, congenital anomalies, tumors, vascular catastrophes, osteomyelitis of vertebrae, infections of disc, meninges or cord, space occupying lesions within canal leading to cord or root compression including epidural abscess.   Co morbidities that complicate the patient evaluation   CKD, hypertension  My initial workup includes stone study, urinalysis.  Patient declined pain medication  Additional history obtained from: Nursing notes from this visit.  I ordered, reviewed and interpreted labs which include: Urinalysis small leukocytes, rare bacteria, 11-20 white blood cells  I ordered imaging studies including CT stone study I independently visualized and interpreted imaging which showed enlarged uterus with multiple calcifications likely reflecting uterine fibroids.  Left adnexal cyst with recommendation ultrasound follow-up,  hypoattenuation of the liver with recommendation for nonemergent MRI I agree with the radiologist interpretation  Afebrile, hemodynamically stable.  62 year old female presenting to the ED for evaluation of left-sided low back pain.  This began 10 days ago and has progressively gotten worse.  There is some tenderness to palpation.  Pain is predictably reproducible with movements.  She does not have any urinary symptoms.  Likely musculoskeletal in nature.  CT stone study was negative for acute abnormalities including stones.  Did show multiple incidental findings including an enlarged uterus with calcifications concerning for uterine fibroids, left adnexal mass, hepatic hypoattenuation area of focus.  Recommendation for ultrasound follow-up of the uterus and left adnexa and MRI of the liver.  She does have a history of uterine fibroids and had an ablation multiple years ago due to this.  These are likely not contributing to her symptoms today.  She was given a prescription for Flexeril and educated on appropriate dosing of Tylenol and ibuprofen at home.  She was given contact information for gynecology and encouraged to follow-up.  Her urine showed a possible urinary tract infection with small leukocytes, rare bacteria and 11-20 white blood cells.  Will treat this with Keflex.  Urine culture was sent.  She was given return precautions.  Stable at discharge.  At this time there does not appear to be any evidence of an acute emergency medical condition and the patient appears stable for discharge with appropriate outpatient follow up. Diagnosis was discussed with patient who verbalizes understanding of care plan and is agreeable to discharge. I have discussed return precautions with patient who verbalizes understanding. Patient encouraged to follow-up with their PCP within 1 week and gynecology as soon as possible. All questions answered.  Note: Portions of this report may have been transcribed using voice  recognition software. Every effort was made to ensure accuracy; however, inadvertent computerized transcription errors may still be present.        Final Clinical Impression(s) / ED Diagnoses Final diagnoses:  Left flank pain  Cystitis  Enlarged uterus  Cyst of left ovary  Liver lesion    Rx / DC Orders ED Discharge Orders          Ordered    cephALEXin (KEFLEX) 500 MG capsule  4 times daily        05/01/23 1046    cyclobenzaprine (FLEXERIL) 10 MG tablet  2 times daily PRN        05/01/23 1046              Michelle Piper, Cordelia Poche 05/01/23 1047    Ernie Avena, MD 05/01/23 1158

## 2023-05-08 ENCOUNTER — Encounter: Payer: Self-pay | Admitting: Internal Medicine

## 2023-05-08 ENCOUNTER — Other Ambulatory Visit: Payer: Self-pay | Admitting: Internal Medicine

## 2023-05-08 DIAGNOSIS — K769 Liver disease, unspecified: Secondary | ICD-10-CM

## 2023-05-14 ENCOUNTER — Other Ambulatory Visit: Payer: Self-pay | Admitting: Internal Medicine

## 2023-05-14 DIAGNOSIS — N838 Other noninflammatory disorders of ovary, fallopian tube and broad ligament: Secondary | ICD-10-CM

## 2023-05-28 ENCOUNTER — Ambulatory Visit
Admission: RE | Admit: 2023-05-28 | Discharge: 2023-05-28 | Disposition: A | Discharge status: Home / Self Care | Payer: Commercial Managed Care - PPO | Source: Ambulatory Visit | Attending: Internal Medicine | Admitting: Internal Medicine

## 2023-05-28 DIAGNOSIS — N838 Other noninflammatory disorders of ovary, fallopian tube and broad ligament: Secondary | ICD-10-CM

## 2023-06-01 ENCOUNTER — Other Ambulatory Visit: Payer: Self-pay | Admitting: Interventional Cardiology

## 2023-07-01 ENCOUNTER — Other Ambulatory Visit: Payer: Self-pay | Admitting: Interventional Cardiology

## 2023-08-16 ENCOUNTER — Other Ambulatory Visit: Payer: Self-pay | Admitting: Internal Medicine

## 2023-08-16 DIAGNOSIS — N838 Other noninflammatory disorders of ovary, fallopian tube and broad ligament: Secondary | ICD-10-CM

## 2024-06-19 ENCOUNTER — Encounter: Payer: Self-pay | Admitting: Internal Medicine

## 2024-09-30 ENCOUNTER — Emergency Department (HOSPITAL_BASED_OUTPATIENT_CLINIC_OR_DEPARTMENT_OTHER)
Admission: EM | Admit: 2024-09-30 | Discharge: 2024-09-30 | Disposition: A | Discharge status: Home / Self Care | Attending: Emergency Medicine | Admitting: Emergency Medicine

## 2024-09-30 ENCOUNTER — Other Ambulatory Visit: Payer: Self-pay

## 2024-09-30 ENCOUNTER — Emergency Department (HOSPITAL_BASED_OUTPATIENT_CLINIC_OR_DEPARTMENT_OTHER): Admitting: Radiology

## 2024-09-30 DIAGNOSIS — N189 Chronic kidney disease, unspecified: Secondary | ICD-10-CM | POA: Insufficient documentation

## 2024-09-30 DIAGNOSIS — E876 Hypokalemia: Secondary | ICD-10-CM | POA: Insufficient documentation

## 2024-09-30 DIAGNOSIS — Z79899 Other long term (current) drug therapy: Secondary | ICD-10-CM | POA: Diagnosis not present

## 2024-09-30 DIAGNOSIS — I129 Hypertensive chronic kidney disease with stage 1 through stage 4 chronic kidney disease, or unspecified chronic kidney disease: Secondary | ICD-10-CM | POA: Insufficient documentation

## 2024-09-30 DIAGNOSIS — R002 Palpitations: Secondary | ICD-10-CM | POA: Diagnosis present

## 2024-09-30 DIAGNOSIS — R0602 Shortness of breath: Secondary | ICD-10-CM | POA: Insufficient documentation

## 2024-09-30 LAB — CBC
HCT: 43 % (ref 36.0–46.0)
Hemoglobin: 14.4 g/dL (ref 12.0–15.0)
MCH: 27.5 pg (ref 26.0–34.0)
MCHC: 33.5 g/dL (ref 30.0–36.0)
MCV: 82.1 fL (ref 80.0–100.0)
Platelets: 260 K/uL (ref 150–400)
RBC: 5.24 MIL/uL — ABNORMAL HIGH (ref 3.87–5.11)
RDW: 13.4 % (ref 11.5–15.5)
WBC: 6.2 K/uL (ref 4.0–10.5)
nRBC: 0 % (ref 0.0–0.2)

## 2024-09-30 LAB — TROPONIN T, HIGH SENSITIVITY: Troponin T High Sensitivity: <15 ng/L (ref 0–19)

## 2024-09-30 LAB — BASIC METABOLIC PANEL WITH GFR
Anion gap: 10 (ref 5–15)
BUN: 15 mg/dL (ref 8–23)
CO2: 27 mmol/L (ref 22–32)
Calcium: 10.2 mg/dL (ref 8.9–10.3)
Chloride: 106 mmol/L (ref 98–111)
Creatinine, Ser: 1.02 mg/dL — ABNORMAL HIGH (ref 0.44–1.00)
GFR, Estimated: >60 mL/min (ref >60)
Glucose, Bld: 87 mg/dL (ref 70–99)
Potassium: 3 mmol/L — ABNORMAL LOW (ref 3.5–5.1)
Sodium: 144 mmol/L (ref 135–145)

## 2024-09-30 LAB — D-DIMER, QUANTITATIVE: D-Dimer, Quant: 0.44 ug{FEU}/mL (ref 0.00–0.50)

## 2024-09-30 LAB — MAGNESIUM: Magnesium: 1.9 mg/dL (ref 1.7–2.4)

## 2024-09-30 MED ORDER — POTASSIUM CHLORIDE CRYS ER 20 MEQ PO TBCR
40.0000 meq | EXTENDED_RELEASE_TABLET | Freq: Once | ORAL | Status: AC
  Administered 2024-09-30: 40 meq via ORAL
  Filled 2024-09-30: qty 2

## 2024-09-30 NOTE — ED Provider Notes (Signed)
 [place] EMERGENCY DEPARTMENT AT [facility] Provider Note   CSN: [id number] Arrival date & time: 09/30/24  1143     Patient presents with: Palpitations   Kristina Thompson is a 63 y.o. female with past medical history seen for hypertension, hyperlipidemia, CKD who presents concern for heart palpitations, shortness of breath associated with talking for the last 4 days.  She reports not worse with walking or exerting herself.  No previous history of similar.  She reports very minimal caffeine use may be 2 cups of coffee a week.  She denies any stimulant use, she denies tobacco use, diabetes.  She reports that she had been waiting to follow-up with a cardiologist but was not able to get a referral through her primary care doctor.  No previous history of blood clots, no recent travel, no recent surgery.  No history of cancer.    Palpitations      Prior to Admission medications   Medication Sig Start Date End Date Taking? Authorizing Provider  amLODipine  (NORVASC ) 10 MG tablet Take 1 tablet (10 mg total) by mouth daily. 07/31/22   Dann Candyce RAMAN, MD  cephALEXin  (KEFLEX ) 500 MG capsule Take 1 capsule (500 mg total) by mouth 4 (four) times daily. 05/01/23   Schutt, Marsa CHRISTELLA, PA-C  colchicine  0.6 MG tablet Take 1 tablet by mouth daily.    [provider]  cyclobenzaprine  (FLEXERIL ) 10 MG tablet Take 1 tablet (10 mg total) by mouth 2 (two) times daily as needed for muscle spasms. 05/01/23   Schutt, Marsa CHRISTELLA, PA-C  losartan (COZAAR) 100 MG tablet Take 100 mg by mouth daily. 07/16/22   [provider]  metoprolol succinate (TOPROL-XL) 25 MG 24 hr tablet Take 25 mg by mouth daily. 07/04/22   [provider]  rosuvastatin  (CRESTOR ) 10 MG tablet Take 1 tablet (10 mg total) by mouth daily. 07/02/23   Dann Candyce RAMAN, MD    Allergies: Patient has no known allergies.    Review of Systems  Cardiovascular:  Positive for palpitations.  All other systems  reviewed and are negative.   Updated Vital Signs BP (!) 135/94 (BP Location: Right Arm)   Pulse 84   Temp 98 F (36.7 C)   Resp 16   Ht 5' 5 (1.651 m)   Wt 94 kg   SpO2 96%   BMI 34.49 kg/m   Physical Exam Vitals and nursing note reviewed.  Constitutional:      General: She is not in acute distress.    Appearance: Normal appearance.  HENT:     Head: Normocephalic and atraumatic.  Eyes:     General:        Right eye: No discharge.        Left eye: No discharge.  Cardiovascular:     Rate and Rhythm: Normal rate and regular rhythm.     Heart sounds: No murmur heard.    No friction rub. No gallop.  Pulmonary:     Effort: Pulmonary effort is normal.     Breath sounds: Normal breath sounds.  Abdominal:     General: Bowel sounds are normal.     Palpations: Abdomen is soft.  Skin:    General: Skin is warm and dry.     Capillary Refill: Capillary refill takes less than 2 seconds.  Neurological:     Mental Status: She is alert and oriented to person, place, and time.  Psychiatric:        Mood and Affect: Mood  normal.        Behavior: Behavior normal.     (all labs ordered are listed, but only abnormal results are displayed) Labs Reviewed  BASIC METABOLIC PANEL WITH GFR - Abnormal; Notable for the following components:      Result Value   Potassium 3.0 (*)    Creatinine, Ser 1.02 (*)    All other components within normal limits  CBC - Abnormal; Notable for the following components:   RBC 5.24 (*)    All other components within normal limits  MAGNESIUM  D-DIMER, QUANTITATIVE (NOT AT [facility])  TROPONIN T, HIGH SENSITIVITY    EKG: EKG Interpretation Date/Time:  Wednesday September 30 2024 11:50:22 EST Ventricular Rate:  79 PR Interval:  172 QRS Duration:  93 QT Interval:  404 QTC Calculation: 464 R Axis:   -12  Text Interpretation: Sinus rhythm Probable anteroseptal infarct, old Baseline wander in lead(s) II III aVF No old tracing to compare Confirmed by Lenor Hollering [phone]) on 09/30/2024 1:33:19 PM  Radiology: [facility] Chest 2 View Result Date: 09/30/2024 CLINICAL DATA:  Shortness of breath for several days.  Palpitations. EXAM: CHEST - 2 VIEW COMPARISON:  None Available. FINDINGS: The heart size and mediastinal contours are within normal limits. Both lungs are clear. The visualized skeletal structures are unremarkable. IMPRESSION: No active cardiopulmonary disease. Electronically Signed   By: Norleen DELENA Kil M.D.   On: 09/30/2024 12:56     Procedures   Medications Ordered in the ED  potassium chloride SA (KLOR-CON M) CR tablet 40 mEq (40 mEq Oral Given 09/30/24 1258)                                    Medical Decision Making Amount and/or Complexity of Data Reviewed Labs: ordered. Radiology: ordered.  Risk Prescription drug management.   This patient is a 63 y.o. female  who presents to the ED for concern of palpitations, shob.   Differential diagnoses prior to evaluation: The emergent differential diagnosis includes, but is not limited to,  The differential diagnosis for palpitations includes cardiac arrhythmias, PVC/PAC, ACS, Cardiomyopathy, CHF, MVP, pericarditis, valvular disease, Panic/Anxiety, Somatic disorder, ETOH, Caffeine,  Stimulant use, medication side effect, Anemia, Hyperthyroidism, pulmonary embolism . This is not an exhaustive differential.   Past Medical History / Co-morbidities / Social History: High blood pressure, high cholesterol  Physical Exam: Physical exam performed. The pertinent findings include: Mild hypertension, pressure 135/94, normal heart rate and rhythm on exam, normal clear breath sounds bilaterally, no respiratory distress  Lab Tests/Imaging studies: I personally interpreted labs/imaging and the pertinent results include: BMP is notable for hypokalemia, Tessman 3.0, very mildly elevated creatinine 1.02.  We will orally replete her potassium.  One-time troponin is negative in context of no chest pain,  palpitations ongoing for several days.  Normal magnesium, D-dimer is negative.  CBC is unremarkable.  I independently interpreted plain film chest x-ray which shows no evidence of acute intrathoracic abnormality.. I agree with the radiologist interpretation.  Cardiac monitoring: EKG obtained and interpreted by myself and attending physician which shows: NSR, no rhythmic abnormality, no acute st-t changes noted   Medications: I ordered medication including potassium for hypokalemia.  I have reviewed the patients home medicines and have made adjustments as needed.   Disposition: After consideration of the diagnostic results and the patients response to treatment, I feel that patient with no identifiable cause of palpitations today, palpitations or  abnormal heart rhythm are not witnessed in the emergency department, I feel that she is stable to follow-up with cardiology on an outpatient basis, patient understands and agrees to plan.   emergency department workup does not suggest an emergent condition requiring admission or immediate intervention beyond what has been performed at this time. The plan is: as above. The patient is safe for discharge and has been instructed to return immediately for worsening symptoms, change in symptoms or any other concerns.   Final diagnoses:  Palpitations  Hypokalemia    ED Discharge Orders          Ordered    Ambulatory referral to Cardiology        09/30/24 1347               Kristina Thompson 09/30/24 1351    Lenor Hollering, MD 09/30/24 1435

## 2024-09-30 NOTE — ED Triage Notes (Signed)
 Patient states heart palpitations and shortness of breath when talking for the past 4 days. States wanted to see a cardiologist but was unable to get a referral.

## 2024-09-30 NOTE — Discharge Instructions (Addendum)
 I did not identify any sign of heart attack, blood clot in your lungs, or other abnormality to explain your intermittent palpitations, your EKG was normal here today.  I placed a referral to the cardiologist to schedule a follow-up appointment.  Please return if you have significant worsening feeling of heart racing, chest pain, shortness of breath.
